# Patient Record
Sex: Female | Born: 1970 | Race: White | Hispanic: No | Marital: Married | Smoking: Never smoker
Health system: Southern US, Community
[De-identification: ages and names within clinical notes are randomized; demographics above are authoritative.]

## PROBLEM LIST (undated history)

## (undated) DIAGNOSIS — F419 Anxiety disorder, unspecified: Secondary | ICD-10-CM

## (undated) DIAGNOSIS — E079 Disorder of thyroid, unspecified: Secondary | ICD-10-CM

## (undated) DIAGNOSIS — E785 Hyperlipidemia, unspecified: Secondary | ICD-10-CM

## (undated) HISTORY — DX: Hyperlipidemia, unspecified: E78.5

## (undated) HISTORY — PX: REDUCTION MAMMAPLASTY: SUR839

## (undated) HISTORY — DX: Disorder of thyroid, unspecified: E07.9

## (undated) HISTORY — PX: BREAST SURGERY: SHX581

## (undated) HISTORY — DX: Anxiety disorder, unspecified: F41.9

---

## 2003-10-12 ENCOUNTER — Encounter: Payer: Self-pay | Admitting: Plastic Surgery

## 2003-10-12 ENCOUNTER — Ambulatory Visit (HOSPITAL_COMMUNITY): Admission: RE | Admit: 2003-10-12 | Discharge: 2003-10-12 | Payer: Self-pay | Admitting: Plastic Surgery

## 2003-11-27 ENCOUNTER — Other Ambulatory Visit: Admission: RE | Admit: 2003-11-27 | Discharge: 2003-11-27 | Payer: Self-pay | Admitting: Internal Medicine

## 2004-12-28 HISTORY — PX: REDUCTION MAMMAPLASTY: SUR839

## 2005-01-07 ENCOUNTER — Ambulatory Visit (HOSPITAL_COMMUNITY): Admission: RE | Admit: 2005-01-07 | Discharge: 2005-01-07 | Payer: Self-pay | Admitting: Internal Medicine

## 2005-06-15 ENCOUNTER — Ambulatory Visit (HOSPITAL_COMMUNITY): Admission: RE | Admit: 2005-06-15 | Discharge: 2005-06-15 | Payer: Self-pay | Admitting: Neurology

## 2006-08-24 ENCOUNTER — Other Ambulatory Visit: Admission: RE | Admit: 2006-08-24 | Discharge: 2006-08-24 | Payer: Self-pay | Admitting: Internal Medicine

## 2008-06-20 ENCOUNTER — Other Ambulatory Visit: Admission: RE | Admit: 2008-06-20 | Discharge: 2008-06-20 | Payer: Self-pay | Admitting: Family Medicine

## 2008-06-28 ENCOUNTER — Encounter: Admission: RE | Admit: 2008-06-28 | Discharge: 2008-06-28 | Payer: Self-pay | Admitting: Family Medicine

## 2010-10-22 ENCOUNTER — Encounter: Admission: RE | Admit: 2010-10-22 | Discharge: 2010-10-22 | Payer: Self-pay | Admitting: *Deleted

## 2010-11-04 ENCOUNTER — Encounter: Admission: RE | Admit: 2010-11-04 | Discharge: 2010-11-04 | Payer: Self-pay | Admitting: *Deleted

## 2011-01-18 ENCOUNTER — Encounter: Payer: Self-pay | Admitting: Family Medicine

## 2011-02-05 ENCOUNTER — Emergency Department (HOSPITAL_COMMUNITY): Payer: BC Managed Care – PPO

## 2011-02-05 ENCOUNTER — Emergency Department (HOSPITAL_COMMUNITY)
Admission: EM | Admit: 2011-02-05 | Discharge: 2011-02-05 | Disposition: A | Discharge status: Home / Self Care | Payer: BC Managed Care – PPO | Attending: Emergency Medicine | Admitting: Emergency Medicine

## 2011-02-05 DIAGNOSIS — E039 Hypothyroidism, unspecified: Secondary | ICD-10-CM | POA: Insufficient documentation

## 2011-02-05 DIAGNOSIS — M542 Cervicalgia: Secondary | ICD-10-CM | POA: Insufficient documentation

## 2011-02-05 DIAGNOSIS — H538 Other visual disturbances: Secondary | ICD-10-CM | POA: Insufficient documentation

## 2011-02-05 DIAGNOSIS — R112 Nausea with vomiting, unspecified: Secondary | ICD-10-CM | POA: Insufficient documentation

## 2011-02-05 DIAGNOSIS — H53149 Visual discomfort, unspecified: Secondary | ICD-10-CM | POA: Insufficient documentation

## 2011-02-05 DIAGNOSIS — R51 Headache: Secondary | ICD-10-CM | POA: Insufficient documentation

## 2011-02-05 DIAGNOSIS — F3289 Other specified depressive episodes: Secondary | ICD-10-CM | POA: Insufficient documentation

## 2011-02-05 DIAGNOSIS — Z79899 Other long term (current) drug therapy: Secondary | ICD-10-CM | POA: Insufficient documentation

## 2011-02-05 DIAGNOSIS — F329 Major depressive disorder, single episode, unspecified: Secondary | ICD-10-CM | POA: Insufficient documentation

## 2011-02-05 DIAGNOSIS — R42 Dizziness and giddiness: Secondary | ICD-10-CM | POA: Insufficient documentation

## 2011-02-05 LAB — BASIC METABOLIC PANEL
Calcium: 9.5 mg/dL (ref 8.4–10.5)
GFR calc Af Amer: >60 mL/min (ref >60)
GFR calc non Af Amer: >60 mL/min (ref >60)
Glucose, Bld: 146 mg/dL — ABNORMAL HIGH (ref 70–99)
Sodium: 134 mEq/L — ABNORMAL LOW (ref 135–145)

## 2011-02-05 LAB — DIFFERENTIAL
Basophils Absolute: 0 10*3/uL (ref 0.0–0.1)
Basophils Relative: 0 % (ref 0–1)
Eosinophils Absolute: 0.1 10*3/uL (ref 0.0–0.7)
Eosinophils Relative: 0 % (ref 0–5)
Monocytes Absolute: 0.5 10*3/uL (ref 0.1–1.0)

## 2011-02-05 LAB — CBC
HCT: 41.6 % (ref 36.0–46.0)
MCHC: 33.9 g/dL (ref 30.0–36.0)
RDW: 13.1 % (ref 11.5–15.5)

## 2011-02-05 LAB — URINALYSIS, ROUTINE W REFLEX MICROSCOPIC
Hgb urine dipstick: NEGATIVE
Protein, ur: NEGATIVE mg/dL
Urobilinogen, UA: 0.2 mg/dL (ref 0.0–1.0)

## 2011-05-15 NOTE — H&P (Signed)
Theresa Hanson, Theresa Hanson             ACCOUNT NO.:  [id number]   MEDICAL RECORD NO.:  [id number]          PATIENT TYPE:  OIB   LOCATION:  2899                         FACILITY:  [facility]   PHYSICIAN:  Dr. Corliss Skains          DATE OF BIRTH:  29-Apr-1971   DATE OF ADMISSION:  06/15/2005  DATE OF DISCHARGE:                                HISTORY & PHYSICAL   CHIEF COMPLAINT:  The patient is here for a cerebral angiogram today.   HISTORY OF PRESENT ILLNESS:  This is a pleasant 40 year old female with a  history of pulsatile tinnitus x4-5 months.  She was evaluated by her primary  care physician, Dr. Julieanne Manson and referred to Dr. Avie Echevaria.  The  patient had a recent MRI/MRA of the brain that was unrevealing.  She has  been referred to Dr. Corliss Skains for a cerebral angiogram.  The patient  reports no other symptoms, except for an episode of dizziness associated  with some mild confusion and the pulsatile tinnitus, as noted.   PAST MEDICAL HISTORY:  The patient has been very healthy.  She has a history  of hyperlipidemia, which is untreated.  She is status post cesarean section,  status post breast reduction surgery.   ALLERGIES:  No known drug allergies.   CURRENT MEDICATIONS:  None.   SOCIAL HISTORY:  The patient is married.  She lives in [place].  She has  2 children.  She smokes a half pack of cigarettes per day and has done so  for 15 years.  She is trying to quit.  She has 3 alcoholic beverages per  week.   FAMILY HISTORY:  Her parents are both in their 47s.  Her mother is alive and  well.  Her father has Parkinson's disease and diabetes mellitus.   REVIEW OF SYSTEMS:  Essentially negative, except for as noted above, and the  patient notes that she bruises easily.   PHYSICAL EXAMINATION:  GENERAL:  A pleasant 40 year old white female in no  acute distress.  VITAL SIGNS:  Blood pressure 102/59, pulse 70, respirations 18, temperature  98.3.  Oxygen saturation 99% on room  air.  Weight 180 pounds.  HEENT:  Unremarkable.  NECK:  No bruits, no jugular venous distention.  HEART:  Regular rate and rhythm without murmur.  LUNGS:  Clear.  ABDOMEN:  Soft, nontender.  EXTREMITIES:  Pulses intact without edema.  SKIN:  Warm and dry.  MENTAL STATUS:  The patient is alert and oriented.  She follows commands.  Cranial nerves II-XII are grossly intact.  Sensation is intact to light  touch.  Motor strength is 5.5 throughout.  Cerebellar testing is intact.   IMPRESSION:  1.  Pulsatile tinnitus with an episode of dizziness and mild confusion.  2.  Negative MRI/MRA.  3.  History of hyperlipidemia.  4.  Status post cesarean section and breast reduction surgery.  5.  Ongoing tobacco use.   PLAN:  The patient will undergo a cerebral angiogram today performed by Dr.  Corliss Skains to further evaluate the above-noted symptoms.      [name]  DR/MEDQ  D:  06/15/2005  T:  06/15/2005  Job:  [id number]   cc:   Genene Churn. Love, M.D.  [address]  [address]  [place]  [place] [address]  Fax: [phone]   Marcene Duos, M.D.  [phone]  [place]  [place] [address]  Fax: [phone]

## 2011-10-15 ENCOUNTER — Other Ambulatory Visit (HOSPITAL_COMMUNITY): Payer: Self-pay | Admitting: Family Medicine

## 2011-10-15 DIAGNOSIS — Z1231 Encounter for screening mammogram for malignant neoplasm of breast: Secondary | ICD-10-CM

## 2011-11-04 ENCOUNTER — Ambulatory Visit (HOSPITAL_COMMUNITY): Payer: BC Managed Care – PPO | Attending: Family Medicine

## 2012-01-04 ENCOUNTER — Other Ambulatory Visit: Payer: Self-pay | Admitting: Family Medicine

## 2012-01-04 DIAGNOSIS — Z1231 Encounter for screening mammogram for malignant neoplasm of breast: Secondary | ICD-10-CM

## 2012-01-11 ENCOUNTER — Ambulatory Visit
Admission: RE | Admit: 2012-01-11 | Discharge: 2012-01-11 | Disposition: A | Discharge status: Home / Self Care | Payer: BC Managed Care – PPO | Source: Ambulatory Visit | Attending: Family Medicine | Admitting: Family Medicine

## 2012-01-11 DIAGNOSIS — Z1231 Encounter for screening mammogram for malignant neoplasm of breast: Secondary | ICD-10-CM

## 2013-04-18 ENCOUNTER — Other Ambulatory Visit: Payer: Self-pay

## 2013-04-18 DIAGNOSIS — Z9889 Other specified postprocedural states: Secondary | ICD-10-CM

## 2013-04-18 DIAGNOSIS — Z1231 Encounter for screening mammogram for malignant neoplasm of breast: Secondary | ICD-10-CM

## 2013-05-17 ENCOUNTER — Ambulatory Visit
Admission: RE | Admit: 2013-05-17 | Discharge: 2013-05-17 | Disposition: A | Discharge status: Home / Self Care | Payer: BC Managed Care – PPO | Source: Ambulatory Visit

## 2013-05-17 DIAGNOSIS — Z9889 Other specified postprocedural states: Secondary | ICD-10-CM

## 2013-05-17 DIAGNOSIS — Z1231 Encounter for screening mammogram for malignant neoplasm of breast: Secondary | ICD-10-CM

## 2013-05-18 ENCOUNTER — Other Ambulatory Visit: Payer: Self-pay | Admitting: Family Medicine

## 2013-05-18 DIAGNOSIS — R928 Other abnormal and inconclusive findings on diagnostic imaging of breast: Secondary | ICD-10-CM

## 2013-05-23 ENCOUNTER — Ambulatory Visit
Admission: RE | Admit: 2013-05-23 | Discharge: 2013-05-23 | Disposition: A | Discharge status: Home / Self Care | Payer: BC Managed Care – PPO | Source: Ambulatory Visit | Attending: Family Medicine | Admitting: Family Medicine

## 2013-05-23 DIAGNOSIS — R928 Other abnormal and inconclusive findings on diagnostic imaging of breast: Secondary | ICD-10-CM

## 2013-05-31 ENCOUNTER — Other Ambulatory Visit: Payer: BC Managed Care – PPO

## 2014-01-10 ENCOUNTER — Other Ambulatory Visit (HOSPITAL_COMMUNITY)
Admission: RE | Admit: 2014-01-10 | Discharge: 2014-01-10 | Disposition: A | Discharge status: Home / Self Care | Payer: BC Managed Care – PPO | Source: Ambulatory Visit | Attending: Family Medicine | Admitting: Family Medicine

## 2014-01-10 ENCOUNTER — Other Ambulatory Visit: Payer: Self-pay | Admitting: Family Medicine

## 2014-01-10 DIAGNOSIS — Z124 Encounter for screening for malignant neoplasm of cervix: Secondary | ICD-10-CM | POA: Insufficient documentation

## 2014-04-23 ENCOUNTER — Other Ambulatory Visit: Payer: Self-pay

## 2014-06-04 ENCOUNTER — Other Ambulatory Visit: Payer: Self-pay

## 2014-06-04 DIAGNOSIS — Z1231 Encounter for screening mammogram for malignant neoplasm of breast: Secondary | ICD-10-CM

## 2014-06-06 ENCOUNTER — Ambulatory Visit: Payer: BC Managed Care – PPO

## 2014-07-24 ENCOUNTER — Ambulatory Visit: Payer: BC Managed Care – PPO

## 2014-07-26 ENCOUNTER — Ambulatory Visit: Payer: BC Managed Care – PPO

## 2014-08-02 ENCOUNTER — Ambulatory Visit (INDEPENDENT_AMBULATORY_CARE_PROVIDER_SITE_OTHER): Payer: BC Managed Care – PPO

## 2014-08-02 ENCOUNTER — Ambulatory Visit (INDEPENDENT_AMBULATORY_CARE_PROVIDER_SITE_OTHER): Payer: BC Managed Care – PPO | Admitting: Podiatry

## 2014-08-02 ENCOUNTER — Encounter: Payer: Self-pay | Admitting: Podiatry

## 2014-08-02 VITALS — BP 110/68 | HR 70 | Resp 14 | Ht 67.0 in | Wt 188.0 lb

## 2014-08-02 DIAGNOSIS — M722 Plantar fascial fibromatosis: Secondary | ICD-10-CM

## 2014-08-02 NOTE — Progress Notes (Signed)
   Subjective:    Patient ID: Theresa Hanson, female    DOB: 05/15/1971, 43 y.o.   MRN: [id number]  HPI Comments: 43 year old female presents today for bilateral foot pain. She states that the child she used to wear orthotics but recently has not had any. She states that she has been running and walking more and has noticed more pain recently. The pain has been present for approximately 2-3 months. She currently is wearing an old sneaker for running. The patient was given a night splint by her friend power she has not used it yet. She denies any trauma to the area. She has not had any previous treatments.     Review of Systems  Musculoskeletal:       B/l foot pain  All other systems reviewed and are negative.      Objective:   Physical Exam  Constitutional: She is oriented to person, place, and time. She appears well-developed and well-nourished.  Musculoskeletal: She exhibits no edema.  Pain on palpation to the plantar medial tubercle of the calcaneus at the insertion of the plantar fascia of the left more severe than the right appeared. No pain with lateral compression of the calcaneus. No pain within the arch of the foot. MMT 5/5 b/l, no pain with ROM. Mild decrease in the medial arch height upon weightbearing. Mild equinus bilaterally  Neurological: She is alert and oriented to person, place, and time.  Protective sensation intact  Skin: Skin is warm. No erythema.  Pedal pulses palpable 2/4 bilaterally. CRT <3 sec b/l.         Assessment & Plan:  A 43 year old female with plantar fasciitis bilaterally. -Today's visit bilateral x-rays were obtained. The x-ray report for full details. New acute fracture noted. -Discussed various treatment options both conservative and surgical as well as the etiology. Treatment options discussed included steroid injections, plantar fascial brace, NSAIDs, orthotics, night splints. At this time and the patient does not want to undergo a steroid  injection as the pain is not that bad. Also discussed the importance of good shoe gear. Stretching exercises discussed.  -Patient states that she will buy a new pair of running shoes and would like to try the plantar fascial brace. She will return to the office in one month where at that time we will reassess and consider a steroid injection and/or orthotic therapy. Recommended an OTC NSAIDs.  -Followup in one month sooner any problems were to arise

## 2014-08-02 NOTE — Patient Instructions (Signed)
Plantar Fasciitis (Heel Spur Syndrome) with Rehab The plantar fascia is a fibrous, ligament-like, soft-tissue structure that spans the bottom of the foot. Plantar fasciitis is a condition that causes pain in the foot due to inflammation of the tissue. SYMPTOMS   Pain and tenderness on the underneath side of the foot.  Pain that worsens with standing or walking. CAUSES  Plantar fasciitis is caused by irritation and injury to the plantar fascia on the underneath side of the foot. Common mechanisms of injury include:  Direct trauma to bottom of the foot.  Damage to a small nerve that runs under the foot where the main fascia attaches to the heel bone.  Stress placed on the plantar fascia due to bone spurs. RISK INCREASES WITH:   Activities that place stress on the plantar fascia (running, jumping, pivoting, or cutting).  Poor strength and flexibility.  Improperly fitted shoes.  Tight calf muscles.  Flat feet.  Failure to warm-up properly before activity.  Obesity. PREVENTION  Warm up and stretch properly before activity.  Allow for adequate recovery between workouts.  Maintain physical fitness:  Strength, flexibility, and endurance.  Cardiovascular fitness.  Maintain a health body weight.  Avoid stress on the plantar fascia.  Wear properly fitted shoes, including arch supports for individuals who have flat feet. PROGNOSIS  If treated properly, then the symptoms of plantar fasciitis usually resolve without surgery. However, occasionally surgery is necessary. RELATED COMPLICATIONS   Recurrent symptoms that may result in a chronic condition.  Problems of the lower back that are caused by compensating for the injury, such as limping.  Pain or weakness of the foot during push-off following surgery.  Chronic inflammation, scarring, and partial or complete fascia tear, occurring more often from repeated injections. TREATMENT  Treatment initially involves the use of  ice and medication to help reduce pain and inflammation. The use of strengthening and stretching exercises may help reduce pain with activity, especially stretches of the Achilles tendon. These exercises may be performed at home or with a therapist. Your caregiver may recommend that you use heel cups of arch supports to help reduce stress on the plantar fascia. Occasionally, corticosteroid injections are given to reduce inflammation. If symptoms persist for greater than 6 months despite non-surgical (conservative), then surgery may be recommended.  MEDICATION   If pain medication is necessary, then nonsteroidal anti-inflammatory medications, such as aspirin and ibuprofen, or other minor pain relievers, such as acetaminophen, are often recommended.  Do not take pain medication within 7 days before surgery.  Prescription pain relievers may be given if deemed necessary by your caregiver. Use only as directed and only as much as you need.  Corticosteroid injections may be given by your caregiver. These injections should be reserved for the most serious cases, because they may only be given a certain number of times. HEAT AND COLD  Cold treatment (icing) relieves pain and reduces inflammation. Cold treatment should be applied for 10 to 15 minutes every 2 to 3 hours for inflammation and pain and immediately after any activity that aggravates your symptoms. Use ice packs or massage the area with a piece of ice (ice massage).  Heat treatment may be used prior to performing the stretching and strengthening activities prescribed by your caregiver, physical therapist, or athletic trainer. Use a heat pack or soak the injury in warm water. SEEK IMMEDIATE MEDICAL CARE IF:  Treatment seems to offer no benefit, or the condition worsens.  Any medications produce adverse side effects. EXERCISES RANGE   OF MOTION (ROM) AND STRETCHING EXERCISES - Plantar Fasciitis (Heel Spur Syndrome) These exercises may help you  when beginning to rehabilitate your injury. Your symptoms may resolve with or without further involvement from your physician, physical therapist or athletic trainer. While completing these exercises, remember:   Restoring tissue flexibility helps normal motion to return to the joints. This allows healthier, less painful movement and activity.  An effective stretch should be held for at least 30 seconds.  A stretch should never be painful. You should only feel a gentle lengthening or release in the stretched tissue. RANGE OF MOTION - Toe Extension, Flexion  Sit with your right / left leg crossed over your opposite knee.  Grasp your toes and gently pull them back toward the top of your foot. You should feel a stretch on the bottom of your toes and/or foot.  Hold this stretch for __________ seconds.  Now, gently pull your toes toward the bottom of your foot. You should feel a stretch on the top of your toes and or foot.  Hold this stretch for __________ seconds. Repeat __________ times. Complete this stretch __________ times per day.  RANGE OF MOTION - Ankle Dorsiflexion, Active Assisted  Remove shoes and sit on a chair that is preferably not on a carpeted surface.  Place right / left foot under knee. Extend your opposite leg for support.  Keeping your heel down, slide your right / left foot back toward the chair until you feel a stretch at your ankle or calf. If you do not feel a stretch, slide your bottom forward to the edge of the chair, while still keeping your heel down.  Hold this stretch for __________ seconds. Repeat __________ times. Complete this stretch __________ times per day.  STRETCH - Gastroc, Standing  Place hands on wall.  Extend right / left leg, keeping the front knee somewhat bent.  Slightly point your toes inward on your back foot.  Keeping your right / left heel on the floor and your knee straight, shift your weight toward the wall, not allowing your back to  arch.  You should feel a gentle stretch in the right / left calf. Hold this position for __________ seconds. Repeat __________ times. Complete this stretch __________ times per day. STRETCH - Soleus, Standing  Place hands on wall.  Extend right / left leg, keeping the other knee somewhat bent.  Slightly point your toes inward on your back foot.  Keep your right / left heel on the floor, bend your back knee, and slightly shift your weight over the back leg so that you feel a gentle stretch deep in your back calf.  Hold this position for __________ seconds. Repeat __________ times. Complete this stretch __________ times per day. STRETCH - Gastrocsoleus, Standing  Note: This exercise can place a lot of stress on your foot and ankle. Please complete this exercise only if specifically instructed by your caregiver.   Place the ball of your right / left foot on a step, keeping your other foot firmly on the same step.  Hold on to the wall or a rail for balance.  Slowly lift your other foot, allowing your body weight to press your heel down over the edge of the step.  You should feel a stretch in your right / left calf.  Hold this position for __________ seconds.  Repeat this exercise with a slight bend in your right / left knee. Repeat __________ times. Complete this stretch __________ times per day.    STRENGTHENING EXERCISES - Plantar Fasciitis (Heel Spur Syndrome)  These exercises may help you when beginning to rehabilitate your injury. They may resolve your symptoms with or without further involvement from your physician, physical therapist or athletic trainer. While completing these exercises, remember:   Muscles can gain both the endurance and the strength needed for everyday activities through controlled exercises.  Complete these exercises as instructed by your physician, physical therapist or athletic trainer. Progress the resistance and repetitions only as guided. STRENGTH -  Towel Curls  Sit in a chair positioned on a non-carpeted surface.  Place your foot on a towel, keeping your heel on the floor.  Pull the towel toward your heel by only curling your toes. Keep your heel on the floor.  If instructed by your physician, physical therapist or athletic trainer, add ____________________ at the end of the towel. Repeat __________ times. Complete this exercise __________ times per day. STRENGTH - Ankle Inversion  Secure one end of a rubber exercise band/tubing to a fixed object (table, pole). Loop the other end around your foot just before your toes.  Place your fists between your knees. This will focus your strengthening at your ankle.  Slowly, pull your big toe up and in, making sure the band/tubing is positioned to resist the entire motion.  Hold this position for __________ seconds.  Have your muscles resist the band/tubing as it slowly pulls your foot back to the starting position. Repeat __________ times. Complete this exercises __________ times per day.  Document Released: 12/14/2005 Document Revised: 03/07/2012 Document Reviewed: 03/28/2009 [employer] Patient Information 2015 [employer]. This information is not intended to replace advice given to you by your health care provider. Make sure you discuss any questions you have with your health care provider.  

## 2014-08-03 ENCOUNTER — Ambulatory Visit: Payer: BC Managed Care – PPO

## 2014-08-10 ENCOUNTER — Ambulatory Visit
Admission: RE | Admit: 2014-08-10 | Discharge: 2014-08-10 | Disposition: A | Discharge status: Home / Self Care | Payer: BC Managed Care – PPO | Source: Ambulatory Visit

## 2014-08-10 DIAGNOSIS — Z1231 Encounter for screening mammogram for malignant neoplasm of breast: Secondary | ICD-10-CM

## 2015-09-17 ENCOUNTER — Other Ambulatory Visit: Payer: Self-pay

## 2015-09-17 DIAGNOSIS — Z9889 Other specified postprocedural states: Secondary | ICD-10-CM

## 2015-09-17 DIAGNOSIS — Z1231 Encounter for screening mammogram for malignant neoplasm of breast: Secondary | ICD-10-CM

## 2015-09-25 ENCOUNTER — Ambulatory Visit
Admission: RE | Admit: 2015-09-25 | Discharge: 2015-09-25 | Disposition: A | Discharge status: Home / Self Care | Payer: BLUE CROSS/BLUE SHIELD | Source: Ambulatory Visit

## 2015-09-25 DIAGNOSIS — Z9889 Other specified postprocedural states: Secondary | ICD-10-CM

## 2015-09-25 DIAGNOSIS — Z1231 Encounter for screening mammogram for malignant neoplasm of breast: Secondary | ICD-10-CM

## 2016-05-29 ENCOUNTER — Other Ambulatory Visit (HOSPITAL_COMMUNITY)
Admission: RE | Admit: 2016-05-29 | Discharge: 2016-05-29 | Disposition: A | Discharge status: Home / Self Care | Payer: BLUE CROSS/BLUE SHIELD | Source: Ambulatory Visit | Attending: Family Medicine | Admitting: Family Medicine

## 2016-05-29 ENCOUNTER — Other Ambulatory Visit: Payer: Self-pay | Admitting: Family Medicine

## 2016-05-29 DIAGNOSIS — N898 Other specified noninflammatory disorders of vagina: Secondary | ICD-10-CM | POA: Diagnosis not present

## 2016-05-29 DIAGNOSIS — Z0000 Encounter for general adult medical examination without abnormal findings: Secondary | ICD-10-CM | POA: Diagnosis not present

## 2016-05-29 DIAGNOSIS — Z01419 Encounter for gynecological examination (general) (routine) without abnormal findings: Secondary | ICD-10-CM | POA: Insufficient documentation

## 2016-06-01 DIAGNOSIS — E559 Vitamin D deficiency, unspecified: Secondary | ICD-10-CM | POA: Diagnosis not present

## 2016-06-01 DIAGNOSIS — Z0000 Encounter for general adult medical examination without abnormal findings: Secondary | ICD-10-CM | POA: Diagnosis not present

## 2016-06-02 LAB — CYTOLOGY - PAP

## 2016-08-24 DIAGNOSIS — D1801 Hemangioma of skin and subcutaneous tissue: Secondary | ICD-10-CM | POA: Diagnosis not present

## 2016-08-24 DIAGNOSIS — D225 Melanocytic nevi of trunk: Secondary | ICD-10-CM | POA: Diagnosis not present

## 2016-08-24 DIAGNOSIS — L918 Other hypertrophic disorders of the skin: Secondary | ICD-10-CM | POA: Diagnosis not present

## 2016-08-24 DIAGNOSIS — Z85828 Personal history of other malignant neoplasm of skin: Secondary | ICD-10-CM | POA: Diagnosis not present

## 2016-09-03 DIAGNOSIS — M722 Plantar fascial fibromatosis: Secondary | ICD-10-CM | POA: Diagnosis not present

## 2016-10-12 ENCOUNTER — Other Ambulatory Visit: Payer: Self-pay | Admitting: Pediatrics

## 2016-10-12 DIAGNOSIS — Z1231 Encounter for screening mammogram for malignant neoplasm of breast: Secondary | ICD-10-CM

## 2016-11-03 ENCOUNTER — Ambulatory Visit: Payer: BLUE CROSS/BLUE SHIELD

## 2016-11-18 ENCOUNTER — Ambulatory Visit
Admission: RE | Admit: 2016-11-18 | Discharge: 2016-11-18 | Disposition: A | Discharge status: Home / Self Care | Payer: BLUE CROSS/BLUE SHIELD | Source: Ambulatory Visit | Attending: Pediatrics | Admitting: Pediatrics

## 2016-11-18 DIAGNOSIS — Z1231 Encounter for screening mammogram for malignant neoplasm of breast: Secondary | ICD-10-CM

## 2016-12-31 DIAGNOSIS — J069 Acute upper respiratory infection, unspecified: Secondary | ICD-10-CM | POA: Diagnosis not present

## 2017-01-04 DIAGNOSIS — E559 Vitamin D deficiency, unspecified: Secondary | ICD-10-CM | POA: Diagnosis not present

## 2017-06-09 IMAGING — MG MM SCREEN MAMMOGRAM BILATERAL
4 series · 4 of 4 positions shown · non-contrast
Comparison: Previous exam(s).

CLINICAL DATA: Screening.

EXAM:
DIGITAL SCREENING BILATERAL MAMMOGRAM WITH CAD

[R CC]
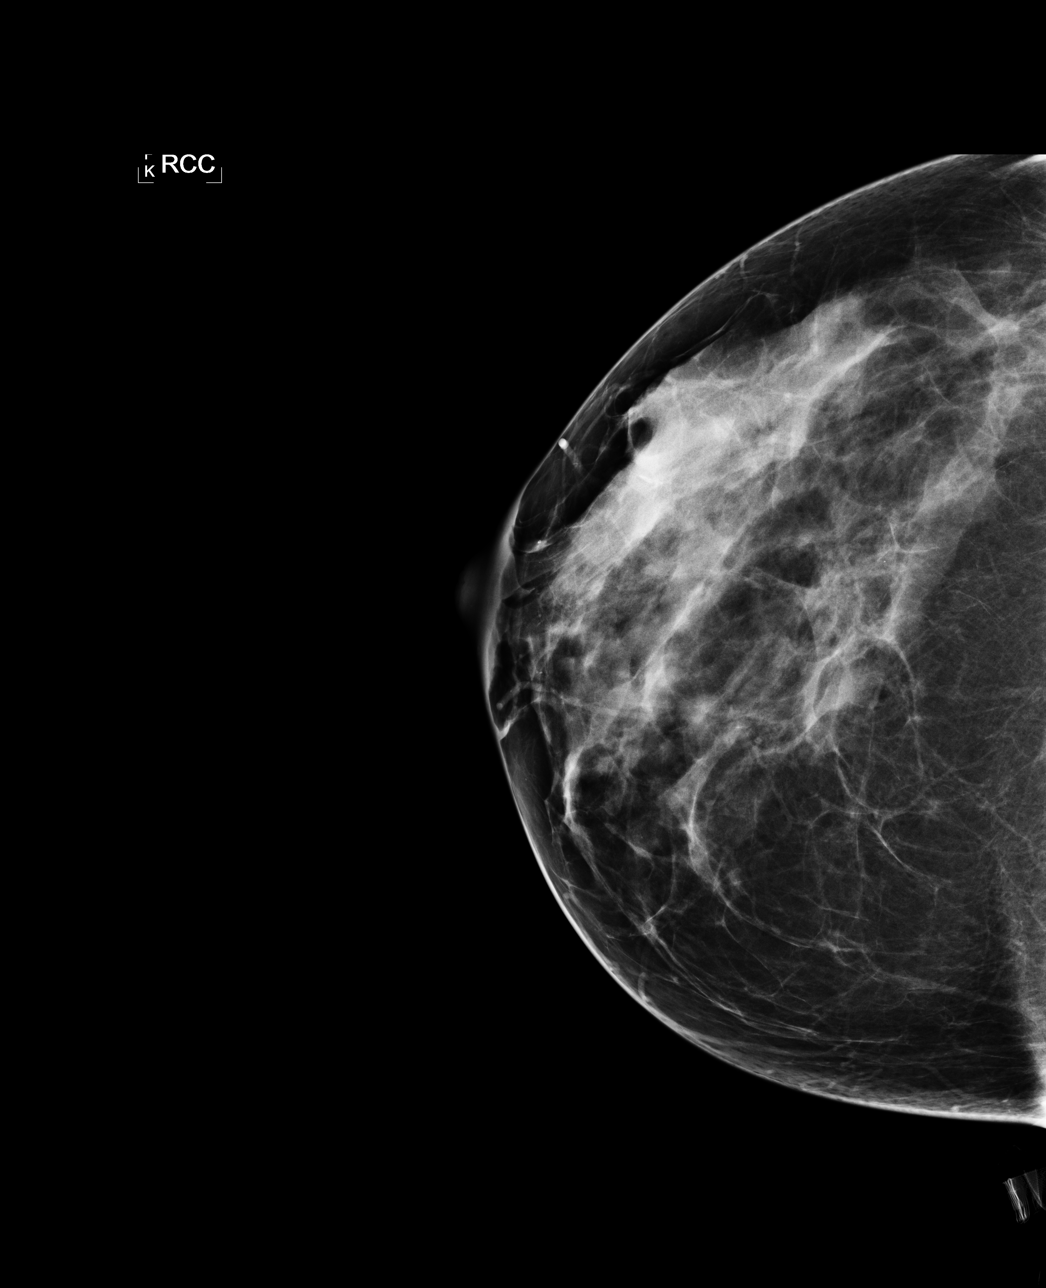

[L CC]
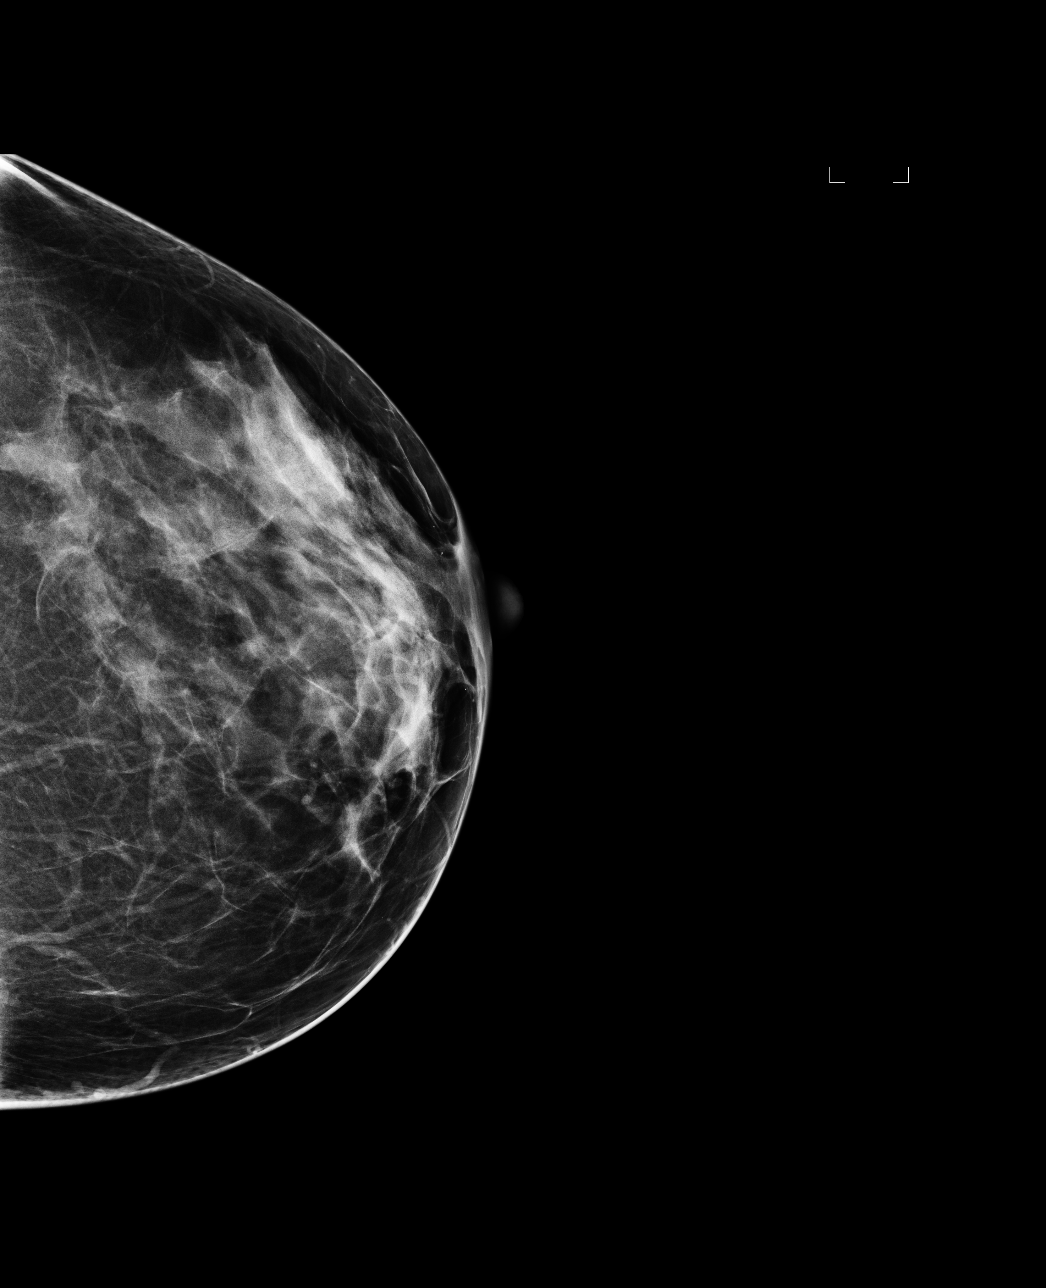

[L MLO]
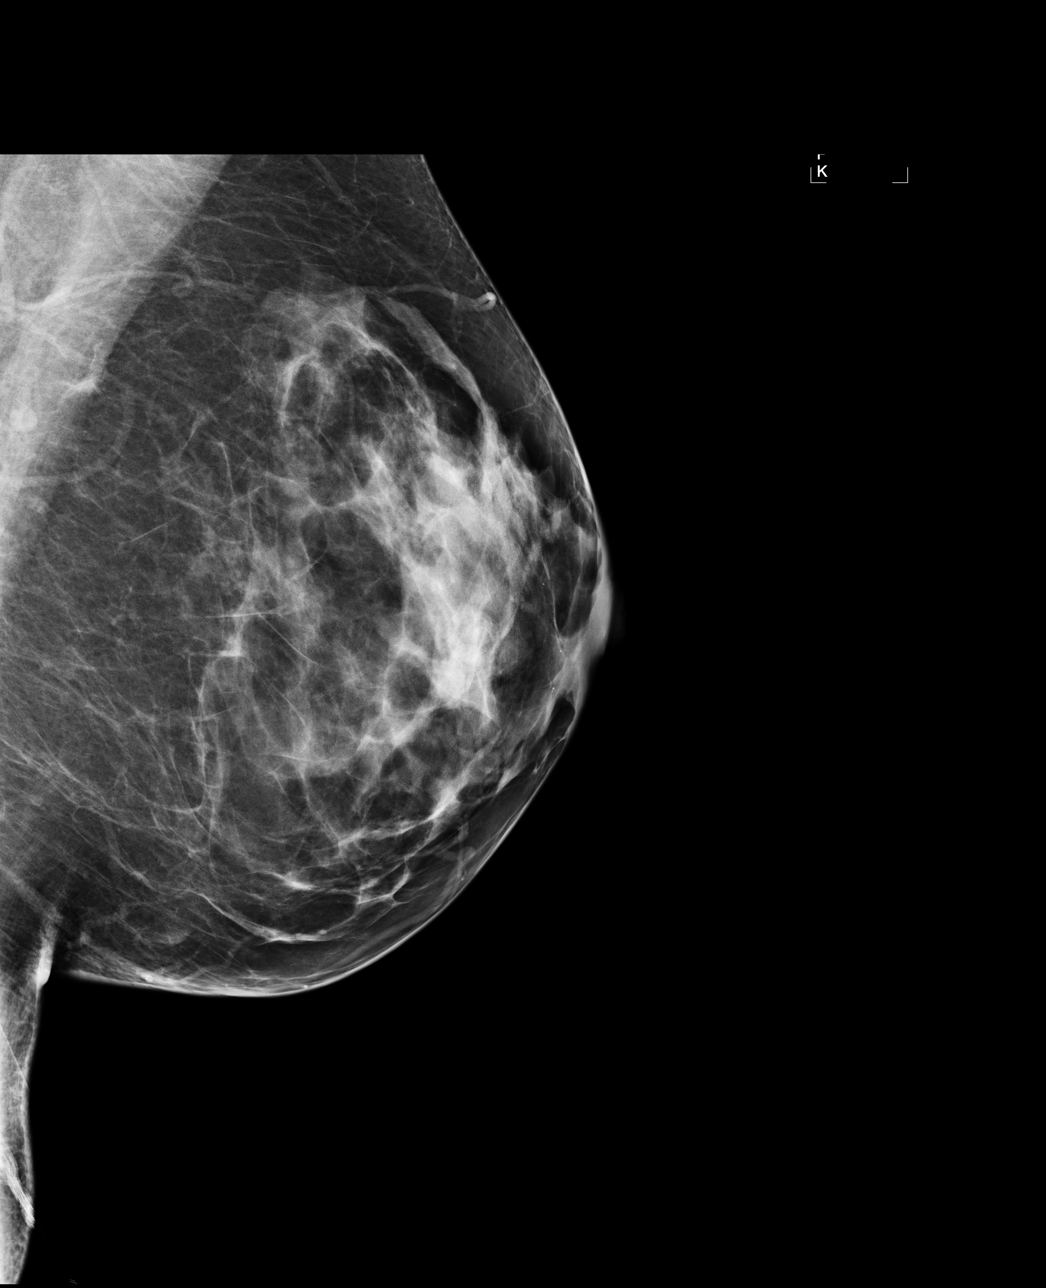

[R MLO]
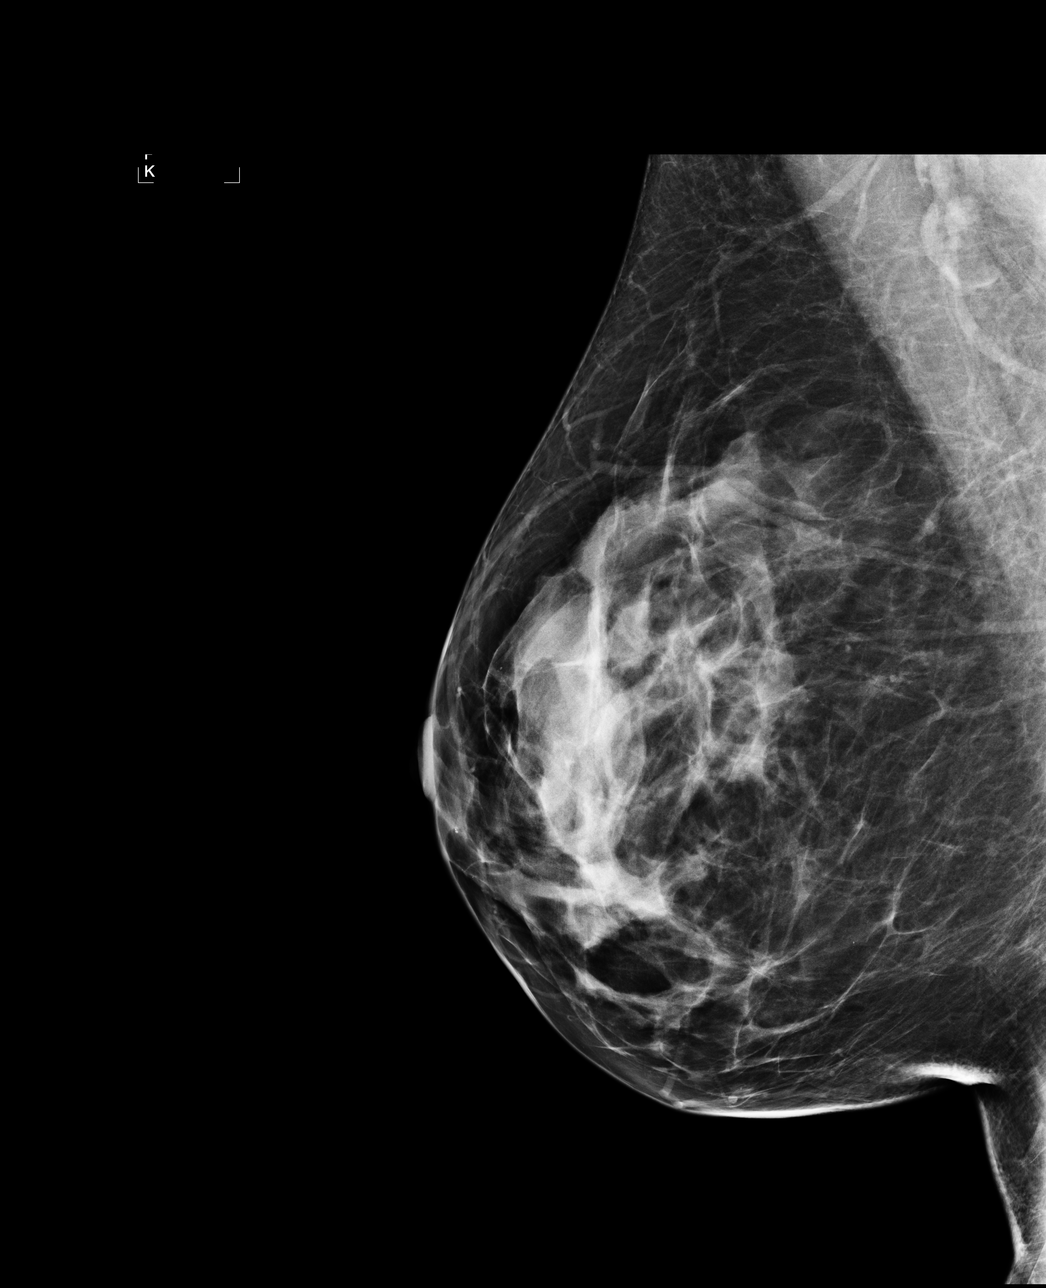

[4 of 4 positions shown; findings below may reference images not displayed]

ACR Breast Density Category c: The breast tissue is heterogeneously
dense, which may obscure small masses.
FINDINGS: There are no findings suspicious for malignancy. Images were
processed with CAD.
IMPRESSION: No mammographic evidence of malignancy. A result letter of this
screening mammogram will be mailed directly to the patient.

RECOMMENDATION:
Screening mammogram in one year. (Code:YJ-[id number])

BI-RADS CATEGORY  1: Negative.

## 2017-08-31 DIAGNOSIS — F325 Major depressive disorder, single episode, in full remission: Secondary | ICD-10-CM | POA: Diagnosis not present

## 2017-08-31 DIAGNOSIS — E785 Hyperlipidemia, unspecified: Secondary | ICD-10-CM | POA: Diagnosis not present

## 2017-08-31 DIAGNOSIS — E039 Hypothyroidism, unspecified: Secondary | ICD-10-CM | POA: Diagnosis not present

## 2017-08-31 DIAGNOSIS — Z23 Encounter for immunization: Secondary | ICD-10-CM | POA: Diagnosis not present

## 2017-08-31 DIAGNOSIS — E559 Vitamin D deficiency, unspecified: Secondary | ICD-10-CM | POA: Diagnosis not present

## 2017-09-01 DIAGNOSIS — E785 Hyperlipidemia, unspecified: Secondary | ICD-10-CM | POA: Diagnosis not present

## 2017-09-01 DIAGNOSIS — E669 Obesity, unspecified: Secondary | ICD-10-CM | POA: Diagnosis not present

## 2017-09-01 DIAGNOSIS — E559 Vitamin D deficiency, unspecified: Secondary | ICD-10-CM | POA: Diagnosis not present

## 2017-09-01 DIAGNOSIS — R7303 Prediabetes: Secondary | ICD-10-CM | POA: Diagnosis not present

## 2017-09-01 DIAGNOSIS — N920 Excessive and frequent menstruation with regular cycle: Secondary | ICD-10-CM | POA: Diagnosis not present

## 2017-09-01 DIAGNOSIS — E039 Hypothyroidism, unspecified: Secondary | ICD-10-CM | POA: Diagnosis not present

## 2017-10-19 DIAGNOSIS — L814 Other melanin hyperpigmentation: Secondary | ICD-10-CM | POA: Diagnosis not present

## 2017-10-19 DIAGNOSIS — D225 Melanocytic nevi of trunk: Secondary | ICD-10-CM | POA: Diagnosis not present

## 2017-10-19 DIAGNOSIS — L819 Disorder of pigmentation, unspecified: Secondary | ICD-10-CM | POA: Diagnosis not present

## 2017-10-19 DIAGNOSIS — Z85828 Personal history of other malignant neoplasm of skin: Secondary | ICD-10-CM | POA: Diagnosis not present

## 2017-12-08 DIAGNOSIS — E039 Hypothyroidism, unspecified: Secondary | ICD-10-CM | POA: Diagnosis not present

## 2017-12-08 DIAGNOSIS — E559 Vitamin D deficiency, unspecified: Secondary | ICD-10-CM | POA: Diagnosis not present

## 2017-12-29 ENCOUNTER — Other Ambulatory Visit: Payer: Self-pay | Admitting: Pediatrics

## 2017-12-29 ENCOUNTER — Other Ambulatory Visit: Payer: Self-pay | Admitting: Obstetrics & Gynecology

## 2017-12-29 DIAGNOSIS — Z1231 Encounter for screening mammogram for malignant neoplasm of breast: Secondary | ICD-10-CM

## 2018-01-19 ENCOUNTER — Ambulatory Visit
Admission: RE | Admit: 2018-01-19 | Discharge: 2018-01-19 | Disposition: A | Discharge status: Home / Self Care | Payer: BLUE CROSS/BLUE SHIELD | Source: Ambulatory Visit | Attending: Obstetrics & Gynecology | Admitting: Obstetrics & Gynecology

## 2018-01-19 DIAGNOSIS — Z1231 Encounter for screening mammogram for malignant neoplasm of breast: Secondary | ICD-10-CM | POA: Diagnosis not present

## 2018-12-13 DIAGNOSIS — L918 Other hypertrophic disorders of the skin: Secondary | ICD-10-CM | POA: Diagnosis not present

## 2018-12-13 DIAGNOSIS — Z85828 Personal history of other malignant neoplasm of skin: Secondary | ICD-10-CM | POA: Diagnosis not present

## 2018-12-13 DIAGNOSIS — D225 Melanocytic nevi of trunk: Secondary | ICD-10-CM | POA: Diagnosis not present

## 2018-12-13 DIAGNOSIS — D2239 Melanocytic nevi of other parts of face: Secondary | ICD-10-CM | POA: Diagnosis not present

## 2019-01-06 DIAGNOSIS — E669 Obesity, unspecified: Secondary | ICD-10-CM | POA: Diagnosis not present

## 2019-01-06 DIAGNOSIS — Z6833 Body mass index (BMI) 33.0-33.9, adult: Secondary | ICD-10-CM | POA: Diagnosis not present

## 2019-01-06 DIAGNOSIS — E039 Hypothyroidism, unspecified: Secondary | ICD-10-CM | POA: Diagnosis not present

## 2019-01-06 DIAGNOSIS — F325 Major depressive disorder, single episode, in full remission: Secondary | ICD-10-CM | POA: Diagnosis not present

## 2019-01-10 DIAGNOSIS — E785 Hyperlipidemia, unspecified: Secondary | ICD-10-CM | POA: Diagnosis not present

## 2019-01-31 ENCOUNTER — Other Ambulatory Visit: Payer: Self-pay | Admitting: Obstetrics & Gynecology

## 2019-01-31 DIAGNOSIS — Z1231 Encounter for screening mammogram for malignant neoplasm of breast: Secondary | ICD-10-CM

## 2019-03-01 ENCOUNTER — Ambulatory Visit: Payer: BLUE CROSS/BLUE SHIELD

## 2019-03-13 ENCOUNTER — Ambulatory Visit: Payer: BLUE CROSS/BLUE SHIELD

## 2019-07-10 ENCOUNTER — Ambulatory Visit
Admission: RE | Admit: 2019-07-10 | Discharge: 2019-07-10 | Disposition: A | Discharge status: Home / Self Care | Payer: BC Managed Care – PPO | Source: Ambulatory Visit | Attending: Obstetrics & Gynecology | Admitting: Obstetrics & Gynecology

## 2019-07-10 ENCOUNTER — Other Ambulatory Visit: Payer: Self-pay

## 2019-07-10 DIAGNOSIS — Z1231 Encounter for screening mammogram for malignant neoplasm of breast: Secondary | ICD-10-CM | POA: Diagnosis not present

## 2019-07-11 DIAGNOSIS — Z20828 Contact with and (suspected) exposure to other viral communicable diseases: Secondary | ICD-10-CM | POA: Diagnosis not present

## 2019-08-17 DIAGNOSIS — Z6833 Body mass index (BMI) 33.0-33.9, adult: Secondary | ICD-10-CM | POA: Diagnosis not present

## 2019-08-17 DIAGNOSIS — E559 Vitamin D deficiency, unspecified: Secondary | ICD-10-CM | POA: Diagnosis not present

## 2019-08-17 DIAGNOSIS — E785 Hyperlipidemia, unspecified: Secondary | ICD-10-CM | POA: Diagnosis not present

## 2019-08-17 DIAGNOSIS — E039 Hypothyroidism, unspecified: Secondary | ICD-10-CM | POA: Diagnosis not present

## 2020-01-08 DIAGNOSIS — E559 Vitamin D deficiency, unspecified: Secondary | ICD-10-CM | POA: Diagnosis not present

## 2020-01-08 DIAGNOSIS — E663 Overweight: Secondary | ICD-10-CM | POA: Diagnosis not present

## 2020-01-08 DIAGNOSIS — F325 Major depressive disorder, single episode, in full remission: Secondary | ICD-10-CM | POA: Diagnosis not present

## 2020-01-08 DIAGNOSIS — E039 Hypothyroidism, unspecified: Secondary | ICD-10-CM | POA: Diagnosis not present

## 2020-03-04 DIAGNOSIS — D225 Melanocytic nevi of trunk: Secondary | ICD-10-CM | POA: Diagnosis not present

## 2020-03-04 DIAGNOSIS — L814 Other melanin hyperpigmentation: Secondary | ICD-10-CM | POA: Diagnosis not present

## 2020-03-04 DIAGNOSIS — L821 Other seborrheic keratosis: Secondary | ICD-10-CM | POA: Diagnosis not present

## 2020-03-04 DIAGNOSIS — L918 Other hypertrophic disorders of the skin: Secondary | ICD-10-CM | POA: Diagnosis not present

## 2020-03-04 DIAGNOSIS — Z85828 Personal history of other malignant neoplasm of skin: Secondary | ICD-10-CM | POA: Diagnosis not present

## 2020-03-04 DIAGNOSIS — D485 Neoplasm of uncertain behavior of skin: Secondary | ICD-10-CM | POA: Diagnosis not present

## 2020-03-04 DIAGNOSIS — L819 Disorder of pigmentation, unspecified: Secondary | ICD-10-CM | POA: Diagnosis not present

## 2020-07-18 ENCOUNTER — Other Ambulatory Visit: Payer: Self-pay | Admitting: Obstetrics & Gynecology

## 2020-07-18 DIAGNOSIS — Z1231 Encounter for screening mammogram for malignant neoplasm of breast: Secondary | ICD-10-CM

## 2020-07-31 DIAGNOSIS — Z20822 Contact with and (suspected) exposure to covid-19: Secondary | ICD-10-CM | POA: Diagnosis not present

## 2020-08-02 DIAGNOSIS — E663 Overweight: Secondary | ICD-10-CM | POA: Diagnosis not present

## 2020-08-02 DIAGNOSIS — F325 Major depressive disorder, single episode, in full remission: Secondary | ICD-10-CM | POA: Diagnosis not present

## 2020-08-02 DIAGNOSIS — E785 Hyperlipidemia, unspecified: Secondary | ICD-10-CM | POA: Diagnosis not present

## 2020-08-02 DIAGNOSIS — E039 Hypothyroidism, unspecified: Secondary | ICD-10-CM | POA: Diagnosis not present

## 2020-08-06 ENCOUNTER — Ambulatory Visit: Payer: BC Managed Care – PPO

## 2020-08-22 ENCOUNTER — Ambulatory Visit: Payer: BC Managed Care – PPO

## 2020-08-27 DIAGNOSIS — N920 Excessive and frequent menstruation with regular cycle: Secondary | ICD-10-CM | POA: Diagnosis not present

## 2020-08-27 DIAGNOSIS — E559 Vitamin D deficiency, unspecified: Secondary | ICD-10-CM | POA: Diagnosis not present

## 2020-08-27 DIAGNOSIS — E039 Hypothyroidism, unspecified: Secondary | ICD-10-CM | POA: Diagnosis not present

## 2020-08-27 DIAGNOSIS — R7301 Impaired fasting glucose: Secondary | ICD-10-CM | POA: Diagnosis not present

## 2020-08-27 DIAGNOSIS — E7800 Pure hypercholesterolemia, unspecified: Secondary | ICD-10-CM | POA: Diagnosis not present

## 2020-08-29 ENCOUNTER — Ambulatory Visit
Admission: RE | Admit: 2020-08-29 | Discharge: 2020-08-29 | Disposition: A | Discharge status: Home / Self Care | Payer: BC Managed Care – PPO | Source: Ambulatory Visit | Attending: Obstetrics & Gynecology | Admitting: Obstetrics & Gynecology

## 2020-08-29 ENCOUNTER — Other Ambulatory Visit: Payer: Self-pay

## 2020-08-29 DIAGNOSIS — Z1231 Encounter for screening mammogram for malignant neoplasm of breast: Secondary | ICD-10-CM | POA: Diagnosis not present

## 2020-09-21 DIAGNOSIS — Z20822 Contact with and (suspected) exposure to covid-19: Secondary | ICD-10-CM | POA: Diagnosis not present

## 2020-09-30 DIAGNOSIS — J22 Unspecified acute lower respiratory infection: Secondary | ICD-10-CM | POA: Diagnosis not present

## 2020-09-30 DIAGNOSIS — R059 Cough, unspecified: Secondary | ICD-10-CM | POA: Diagnosis not present

## 2020-09-30 DIAGNOSIS — R5383 Other fatigue: Secondary | ICD-10-CM | POA: Diagnosis not present

## 2020-09-30 DIAGNOSIS — R590 Localized enlarged lymph nodes: Secondary | ICD-10-CM | POA: Diagnosis not present

## 2020-10-11 DIAGNOSIS — Z23 Encounter for immunization: Secondary | ICD-10-CM | POA: Diagnosis not present

## 2020-10-11 DIAGNOSIS — Z0000 Encounter for general adult medical examination without abnormal findings: Secondary | ICD-10-CM | POA: Diagnosis not present

## 2021-02-05 DIAGNOSIS — Z01812 Encounter for preprocedural laboratory examination: Secondary | ICD-10-CM | POA: Diagnosis not present

## 2021-03-10 DIAGNOSIS — Z85828 Personal history of other malignant neoplasm of skin: Secondary | ICD-10-CM | POA: Diagnosis not present

## 2021-03-10 DIAGNOSIS — D2262 Melanocytic nevi of left upper limb, including shoulder: Secondary | ICD-10-CM | POA: Diagnosis not present

## 2021-03-10 DIAGNOSIS — L814 Other melanin hyperpigmentation: Secondary | ICD-10-CM | POA: Diagnosis not present

## 2021-03-10 DIAGNOSIS — L570 Actinic keratosis: Secondary | ICD-10-CM | POA: Diagnosis not present

## 2021-05-01 DIAGNOSIS — Z1211 Encounter for screening for malignant neoplasm of colon: Secondary | ICD-10-CM | POA: Diagnosis not present

## 2021-05-01 DIAGNOSIS — K5730 Diverticulosis of large intestine without perforation or abscess without bleeding: Secondary | ICD-10-CM | POA: Diagnosis not present

## 2021-05-01 DIAGNOSIS — K640 First degree hemorrhoids: Secondary | ICD-10-CM | POA: Diagnosis not present

## 2021-10-06 ENCOUNTER — Other Ambulatory Visit: Payer: Self-pay | Admitting: Obstetrics & Gynecology

## 2021-10-06 DIAGNOSIS — Z1231 Encounter for screening mammogram for malignant neoplasm of breast: Secondary | ICD-10-CM

## 2021-10-07 DIAGNOSIS — L570 Actinic keratosis: Secondary | ICD-10-CM | POA: Diagnosis not present

## 2021-10-31 ENCOUNTER — Ambulatory Visit
Admission: RE | Admit: 2021-10-31 | Discharge: 2021-10-31 | Disposition: A | Discharge status: Home / Self Care | Payer: BC Managed Care – PPO | Source: Ambulatory Visit | Attending: Obstetrics & Gynecology | Admitting: Obstetrics & Gynecology

## 2021-10-31 DIAGNOSIS — Z1231 Encounter for screening mammogram for malignant neoplasm of breast: Secondary | ICD-10-CM | POA: Diagnosis not present

## 2022-04-08 DIAGNOSIS — E039 Hypothyroidism, unspecified: Secondary | ICD-10-CM | POA: Diagnosis not present

## 2022-04-08 DIAGNOSIS — E559 Vitamin D deficiency, unspecified: Secondary | ICD-10-CM | POA: Diagnosis not present

## 2022-04-08 DIAGNOSIS — N920 Excessive and frequent menstruation with regular cycle: Secondary | ICD-10-CM | POA: Diagnosis not present

## 2022-04-08 DIAGNOSIS — E785 Hyperlipidemia, unspecified: Secondary | ICD-10-CM | POA: Diagnosis not present

## 2022-04-15 DIAGNOSIS — Z85828 Personal history of other malignant neoplasm of skin: Secondary | ICD-10-CM | POA: Diagnosis not present

## 2022-04-15 DIAGNOSIS — D225 Melanocytic nevi of trunk: Secondary | ICD-10-CM | POA: Diagnosis not present

## 2022-04-15 DIAGNOSIS — E559 Vitamin D deficiency, unspecified: Secondary | ICD-10-CM | POA: Diagnosis not present

## 2022-04-15 DIAGNOSIS — Z0000 Encounter for general adult medical examination without abnormal findings: Secondary | ICD-10-CM | POA: Diagnosis not present

## 2022-04-15 DIAGNOSIS — E039 Hypothyroidism, unspecified: Secondary | ICD-10-CM | POA: Diagnosis not present

## 2022-04-15 DIAGNOSIS — Z124 Encounter for screening for malignant neoplasm of cervix: Secondary | ICD-10-CM | POA: Diagnosis not present

## 2022-04-15 DIAGNOSIS — D649 Anemia, unspecified: Secondary | ICD-10-CM | POA: Diagnosis not present

## 2022-04-15 DIAGNOSIS — D1801 Hemangioma of skin and subcutaneous tissue: Secondary | ICD-10-CM | POA: Diagnosis not present

## 2022-04-15 DIAGNOSIS — D2272 Melanocytic nevi of left lower limb, including hip: Secondary | ICD-10-CM | POA: Diagnosis not present

## 2022-04-15 DIAGNOSIS — E7800 Pure hypercholesterolemia, unspecified: Secondary | ICD-10-CM | POA: Diagnosis not present

## 2022-04-15 DIAGNOSIS — Z23 Encounter for immunization: Secondary | ICD-10-CM | POA: Diagnosis not present

## 2022-04-17 ENCOUNTER — Other Ambulatory Visit: Payer: Self-pay | Admitting: Family Medicine

## 2022-04-17 DIAGNOSIS — E7800 Pure hypercholesterolemia, unspecified: Secondary | ICD-10-CM

## 2022-05-05 ENCOUNTER — Inpatient Hospital Stay: Admission: RE | Admit: 2022-05-05 | Payer: BC Managed Care – PPO | Source: Ambulatory Visit

## 2022-05-21 ENCOUNTER — Other Ambulatory Visit: Payer: BC Managed Care – PPO

## 2022-05-22 ENCOUNTER — Other Ambulatory Visit: Payer: BC Managed Care – PPO

## 2022-05-22 ENCOUNTER — Ambulatory Visit
Admission: RE | Admit: 2022-05-22 | Discharge: 2022-05-22 | Disposition: A | Discharge status: Home / Self Care | Payer: No Typology Code available for payment source | Source: Ambulatory Visit | Attending: Family Medicine | Admitting: Family Medicine

## 2022-05-22 DIAGNOSIS — E7800 Pure hypercholesterolemia, unspecified: Secondary | ICD-10-CM

## 2022-06-02 ENCOUNTER — Inpatient Hospital Stay: Admission: RE | Admit: 2022-06-02 | Payer: BC Managed Care – PPO | Source: Ambulatory Visit

## 2022-09-21 DIAGNOSIS — Z23 Encounter for immunization: Secondary | ICD-10-CM | POA: Diagnosis not present

## 2022-09-21 DIAGNOSIS — E7800 Pure hypercholesterolemia, unspecified: Secondary | ICD-10-CM | POA: Diagnosis not present

## 2022-09-28 DIAGNOSIS — E8889 Other specified metabolic disorders: Secondary | ICD-10-CM | POA: Diagnosis not present

## 2022-09-28 DIAGNOSIS — R7301 Impaired fasting glucose: Secondary | ICD-10-CM | POA: Diagnosis not present

## 2022-09-28 DIAGNOSIS — E7800 Pure hypercholesterolemia, unspecified: Secondary | ICD-10-CM | POA: Diagnosis not present

## 2022-09-28 DIAGNOSIS — Z1331 Encounter for screening for depression: Secondary | ICD-10-CM | POA: Diagnosis not present

## 2022-10-17 DIAGNOSIS — Z23 Encounter for immunization: Secondary | ICD-10-CM | POA: Diagnosis not present

## 2022-10-28 ENCOUNTER — Other Ambulatory Visit: Payer: Self-pay | Admitting: Obstetrics & Gynecology

## 2022-10-28 DIAGNOSIS — Z1231 Encounter for screening mammogram for malignant neoplasm of breast: Secondary | ICD-10-CM

## 2022-12-01 DIAGNOSIS — R051 Acute cough: Secondary | ICD-10-CM | POA: Diagnosis not present

## 2022-12-01 DIAGNOSIS — Z20822 Contact with and (suspected) exposure to covid-19: Secondary | ICD-10-CM | POA: Diagnosis not present

## 2022-12-01 DIAGNOSIS — J029 Acute pharyngitis, unspecified: Secondary | ICD-10-CM | POA: Diagnosis not present

## 2022-12-01 DIAGNOSIS — H6692 Otitis media, unspecified, left ear: Secondary | ICD-10-CM | POA: Diagnosis not present

## 2022-12-25 ENCOUNTER — Ambulatory Visit
Admission: RE | Admit: 2022-12-25 | Discharge: 2022-12-25 | Disposition: A | Discharge status: Home / Self Care | Payer: BC Managed Care – PPO | Source: Ambulatory Visit | Attending: Obstetrics & Gynecology | Admitting: Obstetrics & Gynecology

## 2022-12-25 DIAGNOSIS — Z1231 Encounter for screening mammogram for malignant neoplasm of breast: Secondary | ICD-10-CM

## 2023-05-12 DIAGNOSIS — R7301 Impaired fasting glucose: Secondary | ICD-10-CM | POA: Diagnosis not present

## 2023-05-12 DIAGNOSIS — E559 Vitamin D deficiency, unspecified: Secondary | ICD-10-CM | POA: Diagnosis not present

## 2023-05-12 DIAGNOSIS — D649 Anemia, unspecified: Secondary | ICD-10-CM | POA: Diagnosis not present

## 2023-05-12 DIAGNOSIS — Z0000 Encounter for general adult medical examination without abnormal findings: Secondary | ICD-10-CM | POA: Diagnosis not present

## 2023-05-12 DIAGNOSIS — E785 Hyperlipidemia, unspecified: Secondary | ICD-10-CM | POA: Diagnosis not present

## 2023-05-12 DIAGNOSIS — E039 Hypothyroidism, unspecified: Secondary | ICD-10-CM | POA: Diagnosis not present

## 2023-06-09 DIAGNOSIS — D2261 Melanocytic nevi of right upper limb, including shoulder: Secondary | ICD-10-CM | POA: Diagnosis not present

## 2023-06-09 DIAGNOSIS — Z85828 Personal history of other malignant neoplasm of skin: Secondary | ICD-10-CM | POA: Diagnosis not present

## 2023-06-09 DIAGNOSIS — D225 Melanocytic nevi of trunk: Secondary | ICD-10-CM | POA: Diagnosis not present

## 2023-06-09 DIAGNOSIS — D2272 Melanocytic nevi of left lower limb, including hip: Secondary | ICD-10-CM | POA: Diagnosis not present

## 2023-12-30 ENCOUNTER — Other Ambulatory Visit: Payer: Self-pay | Admitting: Family Medicine

## 2023-12-30 DIAGNOSIS — Z1231 Encounter for screening mammogram for malignant neoplasm of breast: Secondary | ICD-10-CM

## 2024-01-19 ENCOUNTER — Ambulatory Visit
Admission: RE | Admit: 2024-01-19 | Discharge: 2024-01-19 | Disposition: A | Discharge status: Home / Self Care | Payer: BC Managed Care – PPO | Source: Ambulatory Visit | Attending: Family Medicine | Admitting: Family Medicine

## 2024-01-19 DIAGNOSIS — Z1231 Encounter for screening mammogram for malignant neoplasm of breast: Secondary | ICD-10-CM | POA: Diagnosis not present

## 2024-01-31 DIAGNOSIS — R051 Acute cough: Secondary | ICD-10-CM | POA: Diagnosis not present

## 2024-05-25 DIAGNOSIS — E039 Hypothyroidism, unspecified: Secondary | ICD-10-CM | POA: Diagnosis not present

## 2024-05-25 DIAGNOSIS — E785 Hyperlipidemia, unspecified: Secondary | ICD-10-CM | POA: Diagnosis not present

## 2024-05-25 DIAGNOSIS — E559 Vitamin D deficiency, unspecified: Secondary | ICD-10-CM | POA: Diagnosis not present

## 2024-05-25 DIAGNOSIS — R7303 Prediabetes: Secondary | ICD-10-CM | POA: Diagnosis not present

## 2024-05-25 DIAGNOSIS — Z23 Encounter for immunization: Secondary | ICD-10-CM | POA: Diagnosis not present

## 2024-05-25 DIAGNOSIS — Z0000 Encounter for general adult medical examination without abnormal findings: Secondary | ICD-10-CM | POA: Diagnosis not present

## 2024-05-25 DIAGNOSIS — F325 Major depressive disorder, single episode, in full remission: Secondary | ICD-10-CM | POA: Diagnosis not present

## 2024-06-21 DIAGNOSIS — E66811 Obesity, class 1: Secondary | ICD-10-CM | POA: Diagnosis not present

## 2024-06-21 DIAGNOSIS — E785 Hyperlipidemia, unspecified: Secondary | ICD-10-CM | POA: Diagnosis not present

## 2024-06-21 DIAGNOSIS — R7303 Prediabetes: Secondary | ICD-10-CM | POA: Diagnosis not present

## 2024-06-21 DIAGNOSIS — Z6832 Body mass index (BMI) 32.0-32.9, adult: Secondary | ICD-10-CM | POA: Diagnosis not present

## 2024-09-26 DIAGNOSIS — Z6829 Body mass index (BMI) 29.0-29.9, adult: Secondary | ICD-10-CM | POA: Diagnosis not present

## 2024-09-26 DIAGNOSIS — E663 Overweight: Secondary | ICD-10-CM | POA: Diagnosis not present

## 2024-09-26 DIAGNOSIS — Z713 Dietary counseling and surveillance: Secondary | ICD-10-CM | POA: Diagnosis not present

## 2024-09-26 DIAGNOSIS — R7303 Prediabetes: Secondary | ICD-10-CM | POA: Diagnosis not present

## 2024-10-03 DIAGNOSIS — L819 Disorder of pigmentation, unspecified: Secondary | ICD-10-CM | POA: Diagnosis not present

## 2024-10-03 DIAGNOSIS — D225 Melanocytic nevi of trunk: Secondary | ICD-10-CM | POA: Diagnosis not present

## 2024-10-03 DIAGNOSIS — Z85828 Personal history of other malignant neoplasm of skin: Secondary | ICD-10-CM | POA: Diagnosis not present

## 2024-10-11 DIAGNOSIS — E039 Hypothyroidism, unspecified: Secondary | ICD-10-CM | POA: Diagnosis not present

## 2024-10-11 DIAGNOSIS — E785 Hyperlipidemia, unspecified: Secondary | ICD-10-CM | POA: Diagnosis not present

## 2024-10-11 DIAGNOSIS — R7303 Prediabetes: Secondary | ICD-10-CM | POA: Diagnosis not present
